# Patient Record
Sex: Male | Born: 2005 | Race: White | Hispanic: Yes | Marital: Single | Smoking: Former smoker
Health system: Southern US, Community
[De-identification: ages and names within clinical notes are randomized; demographics above are authoritative.]

## PROBLEM LIST (undated history)

## (undated) ENCOUNTER — Ambulatory Visit (HOSPITAL_COMMUNITY): Admission: EM | Source: Home / Self Care

## (undated) DIAGNOSIS — J302 Other seasonal allergic rhinitis: Secondary | ICD-10-CM

## (undated) DIAGNOSIS — S0590XA Unspecified injury of unspecified eye and orbit, initial encounter: Secondary | ICD-10-CM

## (undated) DIAGNOSIS — J353 Hypertrophy of tonsils with hypertrophy of adenoids: Secondary | ICD-10-CM

## (undated) DIAGNOSIS — G40A09 Absence epileptic syndrome, not intractable, without status epilepticus: Secondary | ICD-10-CM

## (undated) DIAGNOSIS — R011 Cardiac murmur, unspecified: Secondary | ICD-10-CM

---
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
## 2024-01-29 ENCOUNTER — Emergency Department (HOSPITAL_COMMUNITY)
Admission: EM | Admit: 2024-01-29 | Discharge: 2024-01-29 | Disposition: A | Discharge status: Home / Self Care | Attending: Student in an Organized Health Care Education/Training Program | Admitting: Student in an Organized Health Care Education/Training Program

## 2024-01-29 ENCOUNTER — Other Ambulatory Visit: Payer: Self-pay

## 2024-01-29 ENCOUNTER — Emergency Department (HOSPITAL_COMMUNITY)

## 2024-01-29 ENCOUNTER — Encounter (HOSPITAL_COMMUNITY): Payer: Self-pay

## 2024-01-29 DIAGNOSIS — F419 Anxiety disorder, unspecified: Secondary | ICD-10-CM | POA: Insufficient documentation

## 2024-01-29 DIAGNOSIS — E876 Hypokalemia: Secondary | ICD-10-CM | POA: Diagnosis not present

## 2024-01-29 DIAGNOSIS — R0789 Other chest pain: Secondary | ICD-10-CM | POA: Diagnosis not present

## 2024-01-29 DIAGNOSIS — F1990 Other psychoactive substance use, unspecified, uncomplicated: Secondary | ICD-10-CM | POA: Diagnosis not present

## 2024-01-29 DIAGNOSIS — R079 Chest pain, unspecified: Secondary | ICD-10-CM | POA: Diagnosis present

## 2024-01-29 LAB — BASIC METABOLIC PANEL WITH GFR
Anion gap: 14 (ref 5–15)
BUN: 5 mg/dL — ABNORMAL LOW (ref 6–20)
CO2: 22 mmol/L (ref 22–32)
Calcium: 9.8 mg/dL (ref 8.9–10.3)
Chloride: 102 mmol/L (ref 98–111)
Creatinine, Ser: 0.72 mg/dL (ref 0.61–1.24)
GFR, Estimated: >60 mL/min (ref >60)
Glucose, Bld: 104 mg/dL — ABNORMAL HIGH (ref 70–99)
Potassium: 3.3 mmol/L — ABNORMAL LOW (ref 3.5–5.1)
Sodium: 138 mmol/L (ref 135–145)

## 2024-01-29 LAB — TROPONIN I (HIGH SENSITIVITY)
Troponin I (High Sensitivity): <2 ng/L (ref <18)
Troponin I (High Sensitivity): 2 ng/L (ref <18)

## 2024-01-29 LAB — CBC
HCT: 45.9 % (ref 39.0–52.0)
Hemoglobin: 16.5 g/dL (ref 13.0–17.0)
MCH: 30.7 pg (ref 26.0–34.0)
MCHC: 35.9 g/dL (ref 30.0–36.0)
MCV: 85.5 fL (ref 80.0–100.0)
Platelets: 372 K/uL (ref 150–400)
RBC: 5.37 MIL/uL (ref 4.22–5.81)
RDW: 12 % (ref 11.5–15.5)
WBC: 10.7 K/uL — ABNORMAL HIGH (ref 4.0–10.5)
nRBC: 0 % (ref 0.0–0.2)

## 2024-01-29 NOTE — ED Triage Notes (Signed)
 Pt POV with mother d/t CP and feeling jittery.  Pt did some cocaine around midnight and feels he may have overdosed.

## 2024-01-29 NOTE — Discharge Instructions (Addendum)
 You were evaluated in the emergency department today due to your chest discomfort. Your ED evaluation today did not show any significant life threatening abnormalities requiring hospitalization or further intervention at this moment.  However, some serious conditions may not show up in your testing right away. It is very important that you continue to monitor your symptoms and follow-up closely with your primary care physician or specialist as advised.  Return to the emergency department if you have any further concerns or questions.  Continue taking your daily medications as prescribed.  Return to the emergency department immediately if you develop: - Chest pain that is severe, crushing, or spreads to your arm/jaw/back - Shortness of breath or difficulty breathing - Dizziness, heart palpitations, or fainting - New or worsening pain that concerns you - Sweating or vomiting with your chest pain - Any new concerning symptoms
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
